# Patient Record
Sex: Male | Born: 1990 | Race: White | Hispanic: No
Health system: Southern US, Community
[De-identification: ages and names within clinical notes are randomized; demographics above are authoritative.]

## PROBLEM LIST (undated history)

## (undated) DIAGNOSIS — R569 Unspecified convulsions: Secondary | ICD-10-CM

---
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
## 2012-03-23 ENCOUNTER — Emergency Department (HOSPITAL_COMMUNITY)
Admission: EM | Admit: 2012-03-23 | Discharge: 2012-03-23 | Disposition: A | Discharge status: Home / Self Care | Payer: Self-pay | Attending: Emergency Medicine | Admitting: Emergency Medicine

## 2012-03-23 ENCOUNTER — Encounter (HOSPITAL_COMMUNITY): Payer: Self-pay | Admitting: *Deleted

## 2012-03-23 DIAGNOSIS — R112 Nausea with vomiting, unspecified: Secondary | ICD-10-CM | POA: Insufficient documentation

## 2012-03-23 DIAGNOSIS — R569 Unspecified convulsions: Secondary | ICD-10-CM | POA: Insufficient documentation

## 2012-03-23 DIAGNOSIS — R000 Tachycardia, unspecified: Secondary | ICD-10-CM | POA: Insufficient documentation

## 2012-03-23 DIAGNOSIS — Z79899 Other long term (current) drug therapy: Secondary | ICD-10-CM | POA: Insufficient documentation

## 2012-03-23 HISTORY — DX: Unspecified convulsions: R56.9

## 2012-03-23 LAB — POCT I-STAT, CHEM 8
Chloride: 112 mEq/L (ref 96–112)
Glucose, Bld: 107 mg/dL — ABNORMAL HIGH (ref 70–99)
HCT: 51 % (ref 39.0–52.0)
Hemoglobin: 17.3 g/dL — ABNORMAL HIGH (ref 13.0–17.0)
Potassium: 4 mEq/L (ref 3.5–5.1)
Sodium: 142 mEq/L (ref 135–145)

## 2012-03-23 MED ORDER — SODIUM CHLORIDE 0.9 % IV BOLUS (SEPSIS)
1000.0000 mL | Freq: Once | INTRAVENOUS | Status: AC
  Administered 2012-03-23: 1000 mL via INTRAVENOUS

## 2012-03-23 MED ORDER — ONDANSETRON HCL 4 MG/2ML IJ SOLN
4.0000 mg | Freq: Once | INTRAMUSCULAR | Status: AC
  Administered 2012-03-23: 4 mg via INTRAVENOUS

## 2012-03-23 MED ORDER — ONDANSETRON HCL 4 MG/2ML IJ SOLN
INTRAMUSCULAR | Status: AC
  Administered 2012-03-23: 4 mg via INTRAVENOUS
  Filled 2012-03-23: qty 2

## 2012-03-23 NOTE — ED Notes (Addendum)
Pt had witnessed seizure at home (lasting about 2-3 minutes). Pt has history of seizures. Pt actively vomiting en route and on arrival to room (pt typically has vomiting after seizure. Pt meds recently changed to lamictal about a month ago.

## 2012-03-23 NOTE — ED Notes (Signed)
Bed:WA08<BR> Expected date:<BR> Expected time:<BR> Means of arrival:<BR> Comments:<BR> EMS

## 2012-03-23 NOTE — ED Provider Notes (Signed)
Medical screening examination/treatment/procedure(s) were performed by non-physician practitioner and as supervising physician I was immediately available for consultation/collaboration.  Lindy Pennisi T Caston Coopersmith, MD 03/23/12 2303 

## 2012-03-23 NOTE — ED Provider Notes (Signed)
History     CSN: [id number]  Arrival date & time 03/23/12  2057   First MD Initiated Contact with Patient 03/23/12 2107      Chief Complaint  Patient presents with  . Seizures    (Consider location/radiation/quality/duration/timing/severity/associated sxs/prior treatment) HPI Comments: Patient with history of epileptic seizures presents today with complaint of seizure. Seizure was witnessed by the patient's sister. Patient had 2-3 minutes of tonic-clonic activity (she states that this was usual seizure). Patient was helped to floor and did not fall. + aura, + postictal state. Patient states that he has not been sleeping well and lack of sleep is a trigger for seizures. Patient sees a neurologist in [place]. Patient has been taking Topamax and Lamictal is been added 3 weeks ago. Patient does not currently complain of any pain. Positive loss of bladder. No recent illness or fever. Patient vomited during transit the hospital. No shortness of breath. Onset acute. Course results. Nothing makes symptoms better or worse.  The history is provided by the patient.    No past medical history on file.  No past surgical history on file.  No family history on file.  History  Substance Use Topics  . Smoking status: Not on file  . Smokeless tobacco: Not on file  . Alcohol Use: Not on file      Review of Systems  Constitutional: Negative for fever.  HENT: Positive for congestion and rhinorrhea. Negative for sore throat.   Eyes: Negative for redness.  Respiratory: Negative for cough.   Cardiovascular: Negative for chest pain.  Gastrointestinal: Positive for nausea and vomiting. Negative for abdominal pain and diarrhea.  Genitourinary: Negative for dysuria.  Musculoskeletal: Negative for myalgias.  Skin: Negative for rash.  Neurological: Positive for seizures. Negative for syncope and headaches.    Allergies  Review of patient's allergies indicates not on file.  Home  Medications   Current Outpatient Rx  Name Route Sig Dispense Refill  . CETIRIZINE HCL 10 MG PO TABS Oral Take 10 mg by mouth as needed. Allergies    . IBUPROFEN 200 MG PO TABS Oral Take 200 mg by mouth every 6 (six) hours as needed. Pain    . LAMOTRIGINE 25 MG PO TABS Oral Take 25 mg by mouth 2 (two) times daily.    . TOPIRAMATE 200 MG PO TABS Oral Take 200 mg by mouth 2 (two) times daily.      BP 127/55  Pulse 141  Resp 23  SpO2 89%  Physical Exam  Nursing note and vitals reviewed. Constitutional: He is oriented to person, place, and time. He appears well-developed and well-nourished.  HENT:  Head: Normocephalic and atraumatic. Head is without raccoon's eyes and without Battle's sign.  Right Ear: Tympanic membrane, external ear and ear canal normal. No hemotympanum.  Left Ear: Tympanic membrane, external ear and ear canal normal. No hemotympanum.  Nose: Mucosal edema and rhinorrhea present. No nasal septal hematoma.  Mouth/Throat: Oropharynx is clear and moist. Mucous membranes are dry.  Eyes: Conjunctivae normal, EOM and lids are normal. Pupils are equal, round, and reactive to light. Right eye exhibits no discharge. Left eye exhibits no discharge.       No visible hyphema  Neck: Normal range of motion. Neck supple.  Cardiovascular: Regular rhythm and normal heart sounds.  Tachycardia present.   Pulmonary/Chest: Effort normal and breath sounds normal.  Abdominal: Soft. There is no tenderness.  Musculoskeletal: Normal range of motion.       Cervical back: He  exhibits normal range of motion, no tenderness and no bony tenderness.       Thoracic back: He exhibits no tenderness and no bony tenderness.       Lumbar back: He exhibits no tenderness and no bony tenderness.  Neurological: He is alert and oriented to person, place, and time. He has normal strength and normal reflexes. No cranial nerve deficit or sensory deficit. Coordination and gait normal. GCS eye subscore is 4. GCS verbal  subscore is 5. GCS motor subscore is 6.  Skin: Skin is warm and dry.  Psychiatric: He has a normal mood and affect.    ED Course  Procedures (including critical care time)  Labs Reviewed  POCT I-STAT, CHEM 8 - Abnormal; Notable for the following:    Glucose, Bld 107 (*)     Calcium, Ion 1.28 (*)     Hemoglobin 17.3 (*)     All other components within normal limits  GLUCOSE, CAPILLARY   No results found.   1. Seizure     9:17 PM Patient seen and examined. Work-up initiated. HR 140.    Vital signs reviewed and are as follows: Filed Vitals:   03/23/12 2109  BP: 127/55  Pulse: 141  Resp: 23   9:54 PM HR 115 on re-exam. Will give fluids, check EKG.    Date: 03/23/2012  Rate: 127  Rhythm: sinus tachycardia  QRS Axis: normal  Intervals: normal  ST/T Wave abnormalities: normal  Conduction Disutrbances:none  Narrative Interpretation:   Old EKG Reviewed: none available  10:57 PM heart rate improved into the 90s with fluid bolus. Patient and family agreeable to discharge. Patient appears well. Urged followup with primary care physician and neurologist. Patient to continue taking Topamax and lamotrigine as prescribed. Patient urged to return with worsening symptoms, he has another seizure, or if any other concerns. Patient and family verbalized understanding and agreed plan.  BP 123/76  Pulse 96  Temp 98.1 F (36.7 C) (Oral)  Resp 14  SpO2 100%   MDM  Patient with typical seizure, likely secondary to lack of sleep. Patient has improved during ED stay and has not had another seizure. Initially tachycardic, improved with fluids. Patient appears well. He has neurology followup. Family will watch patient. No evidence of significant head or neck injury. No evidence of significant infection.        Renne Crigler, [place] 03/23/12 2259

## 2019-11-19 ENCOUNTER — Emergency Department (HOSPITAL_COMMUNITY)
Admission: EM | Admit: 2019-11-19 | Discharge: 2019-11-19 | Disposition: A | Discharge status: Home / Self Care | Payer: Self-pay | Attending: Emergency Medicine | Admitting: Emergency Medicine

## 2019-11-19 ENCOUNTER — Emergency Department (HOSPITAL_COMMUNITY): Payer: Self-pay

## 2019-11-19 ENCOUNTER — Other Ambulatory Visit: Payer: Self-pay

## 2019-11-19 DIAGNOSIS — R040 Epistaxis: Secondary | ICD-10-CM | POA: Insufficient documentation

## 2019-11-19 DIAGNOSIS — R569 Unspecified convulsions: Secondary | ICD-10-CM | POA: Insufficient documentation

## 2019-11-19 DIAGNOSIS — Z79899 Other long term (current) drug therapy: Secondary | ICD-10-CM | POA: Insufficient documentation

## 2019-11-19 DIAGNOSIS — W01198A Fall on same level from slipping, tripping and stumbling with subsequent striking against other object, initial encounter: Secondary | ICD-10-CM | POA: Insufficient documentation

## 2019-11-19 LAB — CBC WITH DIFFERENTIAL/PLATELET
Abs Immature Granulocytes: 0.12 10*3/uL — ABNORMAL HIGH (ref 0.00–0.07)
Basophils Absolute: 0 10*3/uL (ref 0.0–0.1)
Basophils Relative: 0 %
Eosinophils Absolute: 0.1 10*3/uL (ref 0.0–0.5)
Eosinophils Relative: 1 %
HCT: 44.7 % (ref 39.0–52.0)
Hemoglobin: 14.6 g/dL (ref 13.0–17.0)
Immature Granulocytes: 1 %
Lymphocytes Relative: 12 %
Lymphs Abs: 1.6 10*3/uL (ref 0.7–4.0)
MCH: 27.5 pg (ref 26.0–34.0)
MCHC: 32.7 g/dL (ref 30.0–36.0)
MCV: 84.3 fL (ref 80.0–100.0)
Monocytes Absolute: 0.9 10*3/uL (ref 0.1–1.0)
Monocytes Relative: 7 %
Neutro Abs: 9.9 10*3/uL — ABNORMAL HIGH (ref 1.7–7.7)
Neutrophils Relative %: 79 %
Platelets: 296 10*3/uL (ref 150–400)
RBC: 5.3 MIL/uL (ref 4.22–5.81)
RDW: 13.1 % (ref 11.5–15.5)
WBC: 12.5 10*3/uL — ABNORMAL HIGH (ref 4.0–10.5)
nRBC: 0 % (ref 0.0–0.2)

## 2019-11-19 LAB — COMPREHENSIVE METABOLIC PANEL
ALT: 50 U/L — ABNORMAL HIGH (ref 0–44)
AST: 38 U/L (ref 15–41)
Albumin: 4.3 g/dL (ref 3.5–5.0)
Alkaline Phosphatase: 57 U/L (ref 38–126)
Anion gap: 12 (ref 5–15)
BUN: 16 mg/dL (ref 6–20)
CO2: 17 mmol/L — ABNORMAL LOW (ref 22–32)
Calcium: 9.2 mg/dL (ref 8.9–10.3)
Chloride: 106 mmol/L (ref 98–111)
Creatinine, Ser: 1.36 mg/dL — ABNORMAL HIGH (ref 0.61–1.24)
GFR calc Af Amer: >60 mL/min (ref >60)
GFR calc non Af Amer: >60 mL/min (ref >60)
Glucose, Bld: 118 mg/dL — ABNORMAL HIGH (ref 70–99)
Potassium: 4.2 mmol/L (ref 3.5–5.1)
Sodium: 135 mmol/L (ref 135–145)
Total Bilirubin: 0.6 mg/dL (ref 0.3–1.2)
Total Protein: 7.5 g/dL (ref 6.5–8.1)

## 2019-11-19 MED ORDER — LEVETIRACETAM IN NACL 1000 MG/100ML IV SOLN
1000.0000 mg | Freq: Once | INTRAVENOUS | Status: AC
  Administered 2019-11-19: 1000 mg via INTRAVENOUS
  Filled 2019-11-19: qty 100

## 2019-11-19 MED ORDER — ZONISAMIDE 100 MG PO CAPS
100.0000 mg | ORAL_CAPSULE | Freq: Every day | ORAL | Status: DC
  Administered 2019-11-19: 100 mg via ORAL
  Filled 2019-11-19: qty 1

## 2019-11-19 MED ORDER — SODIUM CHLORIDE 0.9 % IV BOLUS
1000.0000 mL | Freq: Once | INTRAVENOUS | Status: AC
  Administered 2019-11-19: 1000 mL via INTRAVENOUS

## 2019-11-19 MED ORDER — DIVALPROEX SODIUM ER 500 MG PO TB24
500.0000 mg | ORAL_TABLET | Freq: Every day | ORAL | Status: DC
  Administered 2019-11-19: 500 mg via ORAL
  Filled 2019-11-19: qty 1

## 2019-11-19 NOTE — ED Triage Notes (Signed)
Pt arrives from work by EMS where pt may have had a seizure. Pt coworkers estimated pt may have had LOC for about 15 minutes. Pt does have history of seizures. Pt was diaphoretic and postictal on EMS arrival. Pt arrives to ED alert and oriented x 4. Pt reportedly hit corner of table with head when he fell.

## 2019-11-19 NOTE — Discharge Instructions (Signed)
Your history and exam today are consistent with a breakthrough seizure in the setting of missing medications, increase in stress, dehydration, caffeine, and decreased sleep.  Please take your medications and follow-up with neurology.  Please try to work on the above things including hydration.  If any symptoms recur, change, or worsen, please return to the nearest emergency department.

## 2019-11-19 NOTE — ED Notes (Signed)
Patient transported to CT 

## 2019-11-19 NOTE — ED Provider Notes (Signed)
[facility] EMERGENCY DEPARTMENT Provider Note   CSN: [id number] Arrival date & time: 11/19/19  2041     History Chief Complaint  Patient presents with  . Loss of Consciousness    Ricky Reilly is a 29 y.o. male.  The history is provided by the patient, the EMS personnel and medical records. No language interpreter was used.  Seizures Seizure activity on arrival: no   Seizure type:  Unable to specify Initial focality:  None Postictal symptoms: confusion and somnolence   Return to baseline: yes   Severity:  Mild Timing:  Once Progression:  Resolved Context: decreased sleep, medical non-compliance and stress   Context: not drug use, not fever and not previous head injury   Recent head injury:  During the event PTA treatment:  None History of seizures: yes        Past Medical History:  Diagnosis Date  . Seizure     There are no problems to display for this patient.   No past surgical history on file.     No family history on file.  Social History   Tobacco Use  . Smoking status: Never Smoker  Substance Use Topics  . Alcohol use: No  . Drug use: No    Home Medications Prior to Admission medications   Medication Sig Start Date End Date Taking? Authorizing Provider  cetirizine (ZYRTEC) 10 MG tablet Take 10 mg by mouth as needed. Allergies    [provider]  ibuprofen (ADVIL,MOTRIN) 200 MG tablet Take 200 mg by mouth every 6 (six) hours as needed. Pain    [provider]  lamoTRIgine (LAMICTAL) 25 MG tablet Take 25 mg by mouth 2 (two) times daily.    [provider]  topiramate (TOPAMAX) 200 MG tablet Take 200 mg by mouth 2 (two) times daily.    [provider]    Allergies    Patient has no known allergies.  Review of Systems   Review of Systems  Constitutional: Negative for chills, diaphoresis, fatigue and fever.  HENT: Negative for congestion.   Eyes: Negative for photophobia, pain and visual  disturbance.  Respiratory: Negative for cough, chest tightness, shortness of breath and wheezing.   Cardiovascular: Negative for chest pain, palpitations and leg swelling.  Gastrointestinal: Negative for abdominal pain, constipation, diarrhea, nausea and vomiting.  Genitourinary: Negative for dysuria and flank pain.  Musculoskeletal: Negative for back pain, neck pain and neck stiffness.  Neurological: Positive for seizures. Negative for dizziness, weakness, light-headedness, numbness and headaches.  Psychiatric/Behavioral: Negative for agitation and confusion.  All other systems reviewed and are negative.   Physical Exam Updated Vital Signs BP 123/68   Pulse (!) 109   Temp 98.1 F (36.7 C) (Axillary)   Resp 20   SpO2 94%   Physical Exam Vitals and nursing note reviewed.  Constitutional:      General: He is not in acute distress.    Appearance: He is well-developed. He is not ill-appearing, toxic-appearing or diaphoretic.  HENT:     Head:      Nose: Signs of injury present. No laceration, congestion or rhinorrhea.     Right Nostril: No septal hematoma or occlusion.     Left Nostril: Epistaxis present. No septal hematoma or occlusion.     Comments: Dried blood in the nares with no nasal septal hematoma.  Tenderness on the nose.  Abrasion to the tongue with no laceration.  Hemostatic.  Normal extraocular movements with no evidence of entrapment.  Pupils exam unremarkable.    Mouth/Throat:     Mouth: Mucous membranes are moist.     Pharynx: No oropharyngeal exudate or posterior oropharyngeal erythema.  Eyes:     Extraocular Movements: Extraocular movements intact.     Conjunctiva/sclera: Conjunctivae normal.     Pupils: Pupils are equal, round, and reactive to light.  Cardiovascular:     Rate and Rhythm: Normal rate and regular rhythm.     Pulses: Normal pulses.     Heart sounds: No murmur heard.   Pulmonary:     Effort: Pulmonary effort is normal. No respiratory distress.      Breath sounds: Normal breath sounds. No wheezing, rhonchi or rales.  Chest:     Chest wall: No tenderness.  Abdominal:     General: Abdomen is flat.     Palpations: Abdomen is soft.     Tenderness: There is no abdominal tenderness. There is no right CVA tenderness, left CVA tenderness, guarding or rebound.  Musculoskeletal:        General: Tenderness present.     Cervical back: Neck supple. No tenderness.  Skin:    General: Skin is warm and dry.     Capillary Refill: Capillary refill takes less than 2 seconds.     Findings: No erythema.  Neurological:     General: No focal deficit present.     Mental Status: He is alert and oriented to person, place, and time.     Cranial Nerves: No cranial nerve deficit.     Sensory: No sensory deficit.     Motor: No weakness.  Psychiatric:        Mood and Affect: Mood normal.     ED Results / Procedures / Treatments   Labs (all labs ordered are listed, but only abnormal results are displayed) Labs Reviewed  CBC WITH DIFFERENTIAL/PLATELET - Abnormal; Notable for the following components:      Result Value   WBC 12.5 (*)    Neutro Abs 9.9 (*)    Abs Immature Granulocytes 0.12 (*)    All other components within normal limits  COMPREHENSIVE METABOLIC PANEL - Abnormal; Notable for the following components:   CO2 17 (*)    Glucose, Bld 118 (*)    Creatinine, Ser 1.36 (*)    ALT 50 (*)    All other components within normal limits    EKG None  Radiology CT Head Wo Contrast  Result Date: 11/19/2019 CLINICAL DATA:  29 year old male with head trauma. EXAM: CT HEAD WITHOUT CONTRAST CT MAXILLOFACIAL WITHOUT CONTRAST TECHNIQUE: Multidetector CT imaging of the head and maxillofacial structures were performed using the standard protocol without intravenous contrast. Multiplanar CT image reconstructions of the maxillofacial structures were also generated. COMPARISON:  Head CT dated 06/01/2005. FINDINGS: CT HEAD FINDINGS Brain: No evidence of acute  infarction, hemorrhage, hydrocephalus, extra-axial collection or mass lesion/mass effect. Vascular: No hyperdense vessel or unexpected calcification. Skull: Normal. Negative for fracture or focal lesion. Other: None. CT MAXILLOFACIAL FINDINGS Osseous: No acute fracture. No mandibular dislocation. Orbits: The globes and retro-orbital fat are preserved. Sinuses: Clear. Soft tissues: Negative. IMPRESSION: 1. Normal unenhanced CT of the brain. 2. No acute/traumatic facial bone fractures. Electronically Signed   By: Anner Crete M.D.   On: 11/19/2019 22:16   CT Maxillofacial Wo Contrast  Result Date: 11/19/2019 CLINICAL DATA:  29 year old male with head trauma. EXAM: CT HEAD WITHOUT CONTRAST CT MAXILLOFACIAL WITHOUT CONTRAST TECHNIQUE: Multidetector CT imaging of the head and maxillofacial structures were performed  using the standard protocol without intravenous contrast. Multiplanar CT image reconstructions of the maxillofacial structures were also generated. COMPARISON:  Head CT dated 06/01/2005. FINDINGS: CT HEAD FINDINGS Brain: No evidence of acute infarction, hemorrhage, hydrocephalus, extra-axial collection or mass lesion/mass effect. Vascular: No hyperdense vessel or unexpected calcification. Skull: Normal. Negative for fracture or focal lesion. Other: None. CT MAXILLOFACIAL FINDINGS Osseous: No acute fracture. No mandibular dislocation. Orbits: The globes and retro-orbital fat are preserved. Sinuses: Clear. Soft tissues: Negative. IMPRESSION: 1. Normal unenhanced CT of the brain. 2. No acute/traumatic facial bone fractures. Electronically Signed   By: Elgie Collard M.D.   On: 11/19/2019 22:16    Procedures Procedures (including critical care time)  Medications Ordered in ED Medications  divalproex (DEPAKOTE ER) 24 hr tablet 500 mg (500 mg Oral Given 11/19/19 2220)  zonisamide (ZONEGRAN) capsule 100 mg (100 mg Oral Given 11/19/19 2220)  sodium chloride 0.9 % bolus 1,000 mL (0 mLs Intravenous  Stopped 11/19/19 2223)  levETIRAcetam (KEPPRA) IVPB 1000 mg/100 mL premix (0 mg Intravenous Stopped 11/19/19 2259)    ED Course  I have reviewed the triage vital signs and the nursing notes.  Pertinent labs & imaging results that were available during my care of the patient were reviewed by me and considered in my medical decision making (see chart for details).    MDM Rules/Calculators/A&P                          Ricky Reilly is a 29 y.o. male with a past medical history significant for epilepsy currently on a regimen of Depakote, Keppra, and Zonegran who presents for seizure.  Patient says that he started a new job recently and has had increase in stress, increase in caffeine, decrease in sleep, and he reports he thinks he missed his seizure medicine for the last day or 2.  He was at work and was found down in the break room with blood on his face and acting postictal.  Patient thinks he had a several minute seizure and caused the fall.  He otherwise denies any other symptoms today including no recent preceding headaches, neck pain, nausea, vomiting, fevers, chills, neck stiffness, urinary symptoms or GI symptoms.  He otherwise feels back to his baseline now.  EMS reports that he was slightly hypoxic during transport but that has improved during transport.  He also was postictal initially but is now back to his baseline.  On exam, lungs are clear and chest is nontender.  Abdomen is nontender.  No focal neurologic deficits.  Patient does have some tenderness on his nose and some dried blood in his nares.  No evidence of nasal septal hematoma on initial exam.  Normal extraocular movements and pupil exam is unremarkable.  No neck pain or neck tenderness.  Due to the obvious injury to the face and head, will get a CT head and face.  We will also get screening blood work to look for electrolyte imbalances.  As the patient is missed his seizure medications, we will give the home meds orally as well as  IV Keppra to load him.  We will rehydrate the patient due to slight tachycardia.  If work-up is reassuring and he is feeling better, dissipate discharge to follow-up with his neurologist as I suspect this breakthrough seizure was due to missed medication in the context of the stress, caffeine, and missed sleep.  Patient is agreement to this plan, anticipate discharge if work-up is reassuring.  Work-up is reassuring.  Mild leukocytosis likely demargination with a seizure.  Creatinine slightly more elevated than prior however it is not an AKI for him.  Fluids were given for likely mild dehydration.  CT is reassuring with no nasal fractures or intracranial injury.  Patient was loaded with Keppra and will take his home medications.  He agreed this is likely related to breakthrough due to missing medications and the other things we discussed.  Patient will follow up with outpatient neurology.  Patient agrees with plan of care and was discharged in good condition.   Final Clinical Impression(s) / ED Diagnoses Final diagnoses:  Seizure (HCC)    Rx / DC Orders ED Discharge Orders    None      Clinical Impression: 1. Seizure [facility])     Disposition: Discharge  Condition: Good  I have discussed the results, Dx and Tx plan with the pt(& family if present). He/she/they expressed understanding and agree(s) with the plan. Discharge instructions discussed at great length. Strict return precautions discussed and pt &/or family have verbalized understanding of the instructions. No further questions at time of discharge.    New Prescriptions   No medications on file    Follow Up: Your neurologist        Namiyah Grantham, Canary Brim, MD 11/19/19 2316

## 2020-06-07 ENCOUNTER — Other Ambulatory Visit: Payer: Self-pay

## 2020-06-07 ENCOUNTER — Encounter (HOSPITAL_BASED_OUTPATIENT_CLINIC_OR_DEPARTMENT_OTHER): Payer: Self-pay

## 2020-06-07 ENCOUNTER — Emergency Department (HOSPITAL_BASED_OUTPATIENT_CLINIC_OR_DEPARTMENT_OTHER): Payer: PRIVATE HEALTH INSURANCE

## 2020-06-07 ENCOUNTER — Emergency Department (HOSPITAL_BASED_OUTPATIENT_CLINIC_OR_DEPARTMENT_OTHER)
Admission: EM | Admit: 2020-06-07 | Discharge: 2020-06-07 | Disposition: A | Discharge status: Home / Self Care | Payer: PRIVATE HEALTH INSURANCE | Attending: Emergency Medicine | Admitting: Emergency Medicine

## 2020-06-07 DIAGNOSIS — S0512XA Contusion of eyeball and orbital tissues, left eye, initial encounter: Secondary | ICD-10-CM | POA: Diagnosis not present

## 2020-06-07 DIAGNOSIS — Z91040 Latex allergy status: Secondary | ICD-10-CM | POA: Diagnosis not present

## 2020-06-07 DIAGNOSIS — S0993XA Unspecified injury of face, initial encounter: Secondary | ICD-10-CM | POA: Diagnosis present

## 2020-06-07 DIAGNOSIS — R569 Unspecified convulsions: Secondary | ICD-10-CM | POA: Diagnosis not present

## 2020-06-07 DIAGNOSIS — X58XXXA Exposure to other specified factors, initial encounter: Secondary | ICD-10-CM | POA: Diagnosis not present

## 2020-06-07 LAB — CBC WITH DIFFERENTIAL/PLATELET
Abs Immature Granulocytes: 0.37 10*3/uL — ABNORMAL HIGH (ref 0.00–0.07)
Basophils Absolute: 0.1 10*3/uL (ref 0.0–0.1)
Basophils Relative: 1 %
Eosinophils Absolute: 0.4 10*3/uL (ref 0.0–0.5)
Eosinophils Relative: 2 %
HCT: 42.3 % (ref 39.0–52.0)
Hemoglobin: 14.4 g/dL (ref 13.0–17.0)
Immature Granulocytes: 2 %
Lymphocytes Relative: 25 %
Lymphs Abs: 4.2 10*3/uL — ABNORMAL HIGH (ref 0.7–4.0)
MCH: 28.9 pg (ref 26.0–34.0)
MCHC: 34 g/dL (ref 30.0–36.0)
MCV: 84.8 fL (ref 80.0–100.0)
Monocytes Absolute: 1.3 10*3/uL — ABNORMAL HIGH (ref 0.1–1.0)
Monocytes Relative: 8 %
Neutro Abs: 10.6 10*3/uL — ABNORMAL HIGH (ref 1.7–7.7)
Neutrophils Relative %: 62 %
Platelets: 451 10*3/uL — ABNORMAL HIGH (ref 150–400)
RBC: 4.99 MIL/uL (ref 4.22–5.81)
RDW: 13.2 % (ref 11.5–15.5)
WBC: 16.9 10*3/uL — ABNORMAL HIGH (ref 4.0–10.5)
nRBC: 0 % (ref 0.0–0.2)

## 2020-06-07 LAB — BASIC METABOLIC PANEL
Anion gap: 20 — ABNORMAL HIGH (ref 5–15)
BUN: 16 mg/dL (ref 6–20)
CO2: 15 mmol/L — ABNORMAL LOW (ref 22–32)
Calcium: 9.3 mg/dL (ref 8.9–10.3)
Chloride: 100 mmol/L (ref 98–111)
Creatinine, Ser: 1.34 mg/dL — ABNORMAL HIGH (ref 0.61–1.24)
GFR, Estimated: >60 mL/min (ref >60)
Glucose, Bld: 112 mg/dL — ABNORMAL HIGH (ref 70–99)
Potassium: 4.1 mmol/L (ref 3.5–5.1)
Sodium: 135 mmol/L (ref 135–145)

## 2020-06-07 LAB — CBG MONITORING, ED: Glucose-Capillary: 101 mg/dL — ABNORMAL HIGH (ref 70–99)

## 2020-06-07 NOTE — ED Provider Notes (Signed)
[facility] EMERGENCY DEPARTMENT Provider Note   CSN: [id number] Arrival date & time: 06/07/20  1711     History Chief Complaint  Patient presents with  . Seizures    Ricky Reilly is a 30 y.o. male.  The history is provided by the patient.  Seizures Seizure activity on arrival: no   Seizure type:  Unable to specify Initial focality:  Unable to specify Postictal symptoms: somnolence   Return to baseline: no   Severity:  Mild Number of seizures this episode:  1 Recent head injury: bloody nose, bruise left eye. PTA treatment:  None History of seizures: yes        Past Medical History:  Diagnosis Date  . Seizure (HCC)     There are no problems to display for this patient.   History reviewed. No pertinent surgical history.     No family history on file.  Social History   Tobacco Use  . Smoking status: Never Smoker  . Smokeless tobacco: Never Used  Substance Use Topics  . Alcohol use: No  . Drug use: No    Home Medications Prior to Admission medications   Medication Sig Start Date End Date Taking? Authorizing Provider  divalproex (DEPAKOTE ER) 500 MG 24 hr tablet Take 500 mg by mouth every evening. 01/28/19   [provider]  ibuprofen (ADVIL,MOTRIN) 200 MG tablet Take 200 mg by mouth every 6 (six) hours as needed. Pain    [provider]  levETIRAcetam (KEPPRA) 500 MG tablet Take 500 mg by mouth 2 (two) times daily. 08/19/19   [provider]  zonisamide (ZONEGRAN) 100 MG capsule Take 200 mg by mouth 2 (two) times daily. 05/16/18   [provider]    Allergies    Latex  Review of Systems   Review of Systems  Constitutional: Negative for chills and fever.  HENT: Positive for nosebleeds. Negative for ear pain and sore throat.   Eyes: Negative for pain and visual disturbance.  Respiratory: Negative for cough and shortness of breath.   Cardiovascular: Negative for chest pain and palpitations.   Gastrointestinal: Negative for abdominal pain and vomiting.  Genitourinary: Negative for dysuria and hematuria.  Musculoskeletal: Negative for arthralgias and back pain.  Skin: Positive for color change. Negative for rash.  Neurological: Positive for seizures. Negative for syncope.  All other systems reviewed and are negative.   Physical Exam Updated Vital Signs  ED Triage Vitals  Enc Vitals Group     BP 06/07/20 1725 125/60     Pulse Rate 06/07/20 1722 (!) 109     Resp 06/07/20 1722 18     Temp 06/07/20 1726 (!) 96.9 F (36.1 C)     Temp Source 06/07/20 1726 Axillary     SpO2 06/07/20 1725 96 %     Weight 06/07/20 1718 (!) 315 lb 4.8 oz (143 kg)     Height 06/07/20 1718 6' (1.829 m)     Head Circumference --      Peak Flow --      Pain Score 06/07/20 1717 6     Pain Loc --      Pain Edu? --      Excl. in GC? --     Physical Exam Vitals and nursing note reviewed.  Constitutional:      General: He is not in acute distress.    Appearance: He is well-developed and well-nourished. He is not ill-appearing.  HENT:     Head:  Comments: Bruise around the left eye, no lacerations to the scalp, dried blood around the nares, no laceration to the face    Mouth/Throat:     Mouth: Mucous membranes are moist.  Eyes:     Extraocular Movements: Extraocular movements intact.     Conjunctiva/sclera: Conjunctivae normal.     Pupils: Pupils are equal, round, and reactive to light.  Cardiovascular:     Rate and Rhythm: Normal rate and regular rhythm.     Pulses: Normal pulses.     Heart sounds: Normal heart sounds. No murmur heard.   Pulmonary:     Effort: Pulmonary effort is normal. No respiratory distress.     Breath sounds: Normal breath sounds.  Abdominal:     Palpations: Abdomen is soft.     Tenderness: There is no abdominal tenderness.  Musculoskeletal:        General: No edema.     Cervical back: Neck supple.  Skin:    General: Skin is warm and dry.  Neurological:      General: No focal deficit present.     Mental Status: He is alert and oriented to person, place, and time.     Cranial Nerves: No cranial nerve deficit.     Sensory: No sensory deficit.     Motor: No weakness.     Coordination: Coordination normal.     Comments: Somnolent but appears to be neurologically intact, 5+ out of 5 strength all, normal sensation  Psychiatric:        Mood and Affect: Mood and affect normal.     ED Results / Procedures / Treatments   Labs (all labs ordered are listed, but only abnormal results are displayed) Labs Reviewed  CBC WITH DIFFERENTIAL/PLATELET - Abnormal; Notable for the following components:      Result Value   WBC 16.9 (*)    Platelets 451 (*)    Neutro Abs 10.6 (*)    Lymphs Abs 4.2 (*)    Monocytes Absolute 1.3 (*)    Abs Immature Granulocytes 0.37 (*)    All other components within normal limits  BASIC METABOLIC PANEL - Abnormal; Notable for the following components:   CO2 15 (*)    Glucose, Bld 112 (*)    Creatinine, Ser 1.34 (*)    Anion gap 20 (*)    All other components within normal limits  CBG MONITORING, ED - Abnormal; Notable for the following components:   Glucose-Capillary 101 (*)    All other components within normal limits    EKG EKG shows sinus rhythm with normal intervals  Radiology CT Head Wo Contrast  Result Date: 06/07/2020 CLINICAL DATA:  Possible seizure.  Of tended. EXAM: CT HEAD WITHOUT CONTRAST CT MAXILLOFACIAL WITHOUT CONTRAST CT CERVICAL SPINE WITHOUT CONTRAST TECHNIQUE: Multidetector CT imaging of the head, cervical spine, and maxillofacial structures were performed using the standard protocol without intravenous contrast. Multiplanar CT image reconstructions of the cervical spine and maxillofacial structures were also generated. COMPARISON:  11/19/2019 FINDINGS: CT HEAD FINDINGS Brain: The brain shows a normal appearance without evidence of malformation, atrophy, old or acute small or large vessel  infarction, mass lesion, hemorrhage, hydrocephalus or extra-axial collection. Vascular: No hyperdense vessel. No evidence of atherosclerotic calcification. Skull: Normal.  No traumatic finding.  No focal bone lesion. Sinuses/Orbits: Sinuses are clear. Orbits appear normal. Mastoids are clear. Other: None significant CT MAXILLOFACIAL FINDINGS Osseous: No facial fracture. Orbits: No intraorbital injury. Periorbital soft tissue swelling, left more than right. Sinuses: Clear  Soft tissues: Periorbital soft tissue swelling superficially, left more than right, as noted above. CT CERVICAL SPINE FINDINGS Alignment: No traumatic malalignment. Skull base and vertebrae: No fracture or primary bone lesion. Soft tissues and spinal canal: No evidence of soft tissue injury or mass lesion. Disc levels: No degenerative disease. No stenosis of the canal or foramina. Upper chest: Normal Other: None IMPRESSION: HEAD CT: Normal. MAXILLOFACIAL CT: No facial fracture. Periorbital soft tissue swelling, left more than right. No intraorbital injury. CERVICAL SPINE CT: Normal. Electronically Signed   By: Paulina Fusi M.D.   On: 06/07/2020 18:28   CT Cervical Spine Wo Contrast  Result Date: 06/07/2020 CLINICAL DATA:  Possible seizure.  Of tended. EXAM: CT HEAD WITHOUT CONTRAST CT MAXILLOFACIAL WITHOUT CONTRAST CT CERVICAL SPINE WITHOUT CONTRAST TECHNIQUE: Multidetector CT imaging of the head, cervical spine, and maxillofacial structures were performed using the standard protocol without intravenous contrast. Multiplanar CT image reconstructions of the cervical spine and maxillofacial structures were also generated. COMPARISON:  11/19/2019 FINDINGS: CT HEAD FINDINGS Brain: The brain shows a normal appearance without evidence of malformation, atrophy, old or acute small or large vessel infarction, mass lesion, hemorrhage, hydrocephalus or extra-axial collection. Vascular: No hyperdense vessel. No evidence of atherosclerotic calcification.  Skull: Normal.  No traumatic finding.  No focal bone lesion. Sinuses/Orbits: Sinuses are clear. Orbits appear normal. Mastoids are clear. Other: None significant CT MAXILLOFACIAL FINDINGS Osseous: No facial fracture. Orbits: No intraorbital injury. Periorbital soft tissue swelling, left more than right. Sinuses: Clear Soft tissues: Periorbital soft tissue swelling superficially, left more than right, as noted above. CT CERVICAL SPINE FINDINGS Alignment: No traumatic malalignment. Skull base and vertebrae: No fracture or primary bone lesion. Soft tissues and spinal canal: No evidence of soft tissue injury or mass lesion. Disc levels: No degenerative disease. No stenosis of the canal or foramina. Upper chest: Normal Other: None IMPRESSION: HEAD CT: Normal. MAXILLOFACIAL CT: No facial fracture. Periorbital soft tissue swelling, left more than right. No intraorbital injury. CERVICAL SPINE CT: Normal. Electronically Signed   By: Paulina Fusi M.D.   On: 06/07/2020 18:28   DG Chest Portable 1 View  Result Date: 06/07/2020 CLINICAL DATA:  Fall.  Pain. EXAM: PORTABLE CHEST 1 VIEW COMPARISON:  None. FINDINGS: The heart size and mediastinal contours are within normal limits. Both lungs are clear. The visualized skeletal structures are unremarkable. IMPRESSION: No active disease. Electronically Signed   By: Gerome Sam III M.D   On: 06/07/2020 18:38   CT Maxillofacial Wo Contrast  Result Date: 06/07/2020 CLINICAL DATA:  Possible seizure.  Of tended. EXAM: CT HEAD WITHOUT CONTRAST CT MAXILLOFACIAL WITHOUT CONTRAST CT CERVICAL SPINE WITHOUT CONTRAST TECHNIQUE: Multidetector CT imaging of the head, cervical spine, and maxillofacial structures were performed using the standard protocol without intravenous contrast. Multiplanar CT image reconstructions of the cervical spine and maxillofacial structures were also generated. COMPARISON:  11/19/2019 FINDINGS: CT HEAD FINDINGS Brain: The brain shows a normal appearance without  evidence of malformation, atrophy, old or acute small or large vessel infarction, mass lesion, hemorrhage, hydrocephalus or extra-axial collection. Vascular: No hyperdense vessel. No evidence of atherosclerotic calcification. Skull: Normal.  No traumatic finding.  No focal bone lesion. Sinuses/Orbits: Sinuses are clear. Orbits appear normal. Mastoids are clear. Other: None significant CT MAXILLOFACIAL FINDINGS Osseous: No facial fracture. Orbits: No intraorbital injury. Periorbital soft tissue swelling, left more than right. Sinuses: Clear Soft tissues: Periorbital soft tissue swelling superficially, left more than right, as noted above. CT CERVICAL SPINE FINDINGS Alignment: No  traumatic malalignment. Skull base and vertebrae: No fracture or primary bone lesion. Soft tissues and spinal canal: No evidence of soft tissue injury or mass lesion. Disc levels: No degenerative disease. No stenosis of the canal or foramina. Upper chest: Normal Other: None IMPRESSION: HEAD CT: Normal. MAXILLOFACIAL CT: No facial fracture. Periorbital soft tissue swelling, left more than right. No intraorbital injury. CERVICAL SPINE CT: Normal. Electronically Signed   By: Paulina FusiMark  Shogry M.D.   On: 06/07/2020 18:28    Procedures Procedures (including critical care time)  Medications Ordered in ED Medications - No data to display  ED Course  I have reviewed the triage vital signs and the nursing notes.  Pertinent labs & imaging results that were available during my care of the patient were reviewed by me and considered in my medical decision making (see chart for details).    MDM Rules/Calculators/A&P                          Ricky RoeMatthew Quirion is a 30 year old male with history of seizures who presents the ED after breakthrough seizure.  Normal vitals.  No fever.  Patient somnolent but awake and neurologically intact.  States he had a seizure at work.  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  He has dried blood at his nare.  No active nasal bleeding.  Has  bruising on the left eye.  CT of the head, face, neck are unremarkable.  Lab work unremarkable.  Patient is waking up more.  Will allow him to continue to return to normal.  No other seizure events as of now.  He has been compliant with his medications.  CT scans are unremarkable. Patient with unremarkable labs. Patient returned to baseline. He was able to ambulate without any issues. Has been compliant with his seizure medications. He does have some breakthrough seizures at time. Recommend that he follow-up with neurology. Given return precautions and discharged from the ED in good condition.  This chart was dictated using voice recognition software.  Despite best efforts to proofread,  errors can occur which can change the documentation meaning.    Final Clinical Impression(s) / ED Diagnoses Final diagnoses:  Seizure [facility](HCC)    Rx / DC Orders ED Discharge Orders    None       Virgina NorfolkCuratolo, Sae Handrich, DO 06/07/20 1929

## 2020-06-07 NOTE — Discharge Instructions (Signed)
Follow up with your neurologist.

## 2020-06-07 NOTE — ED Triage Notes (Signed)
Pt arrives via [facility] from work where patient was found in walk-in cooler, assumed seizure, pt was postictal, works with room mate who reported to EMS that patient was at his baseline following seizure. Pt oriented upon arrival with swelling in upper lip and nose, dried blood on face.

## 2020-06-07 NOTE — ED Notes (Signed)
No active bleeding noted at this time, but rt and lt nare very bloody, also noted to have swelling at left eyelid/ eyebrow area with slight bruising.

## 2020-06-07 NOTE — ED Notes (Signed)
Arrived via EMS secondary to having a seizure, Alert upon arrival, follows commands. Immediately placed on cont cardiac monitoring and CBG obtained, IV established, EDP to bedside. Seizure Pads to stretcher applied, sr x 2 up, bed in lowest position.

## 2020-06-07 NOTE — ED Notes (Signed)
Noted to fall asleep very quickly, POX on RA when asleep reveals 90%, placed on 2lpm via [place] (pt states he is not aware of having OSA, but is scheduled for a sleep study) Snoring is noted intermittently

## 2020-06-07 NOTE — ED Notes (Signed)
Ambulated to restroom without assistance, gait very steady, voiced no complaints

## 2021-11-09 IMAGING — CT CT HEAD W/O CM
3 series · 14 of 47 positions shown, 16 images · non-contrast
Comparison: 11/19/2019

CLINICAL DATA: Possible seizure.  Of tended.

EXAM:
CT HEAD WITHOUT CONTRAST
CT MAXILLOFACIAL WITHOUT CONTRAST
CT CERVICAL SPINE WITHOUT CONTRAST
TECHNIQUE: Multidetector CT imaging of the head, cervical spine, and
maxillofacial structures were performed using the standard protocol
without intravenous contrast. Multiplanar CT image reconstructions
of the cervical spine and maxillofacial structures were also
generated.

[Series 2: head wo · axial · 0.48mm/px · z∈[+843,+983]mm · 8 of 34 slices shown, 10 images]
[im 3/34  brain]
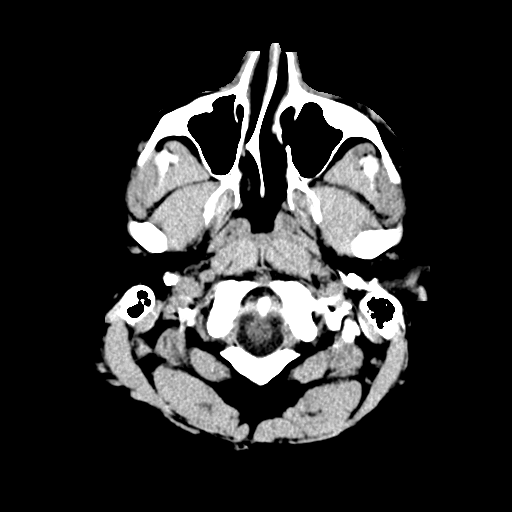
[im 3/34  bone]
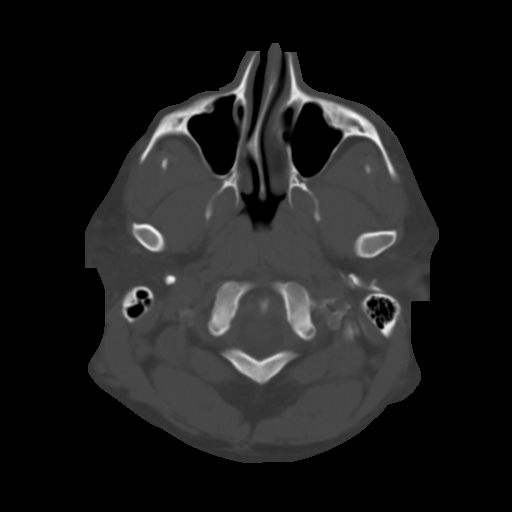
[im 7/34  brain]
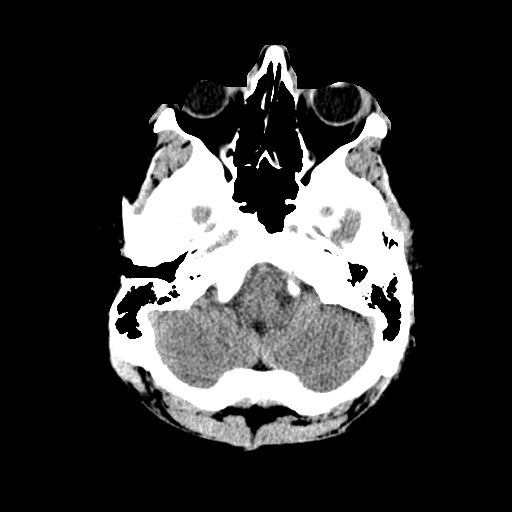
[im 11/34  brain]
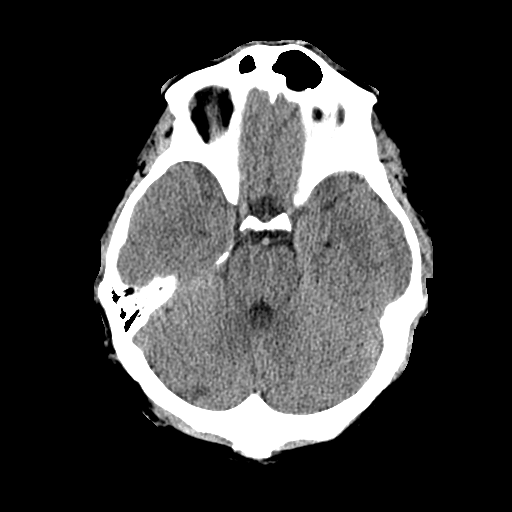
[im 15/34  brain]
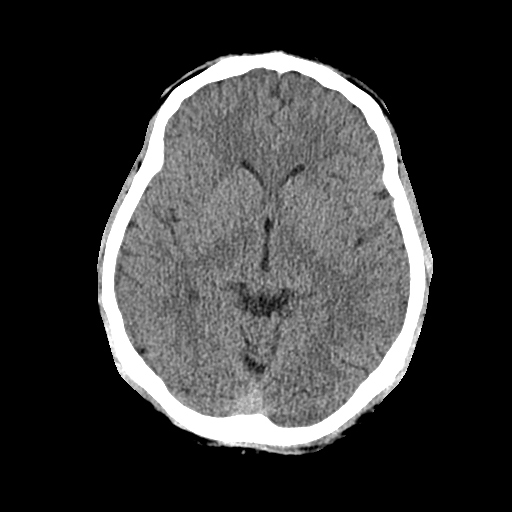
[im 19/34  brain]
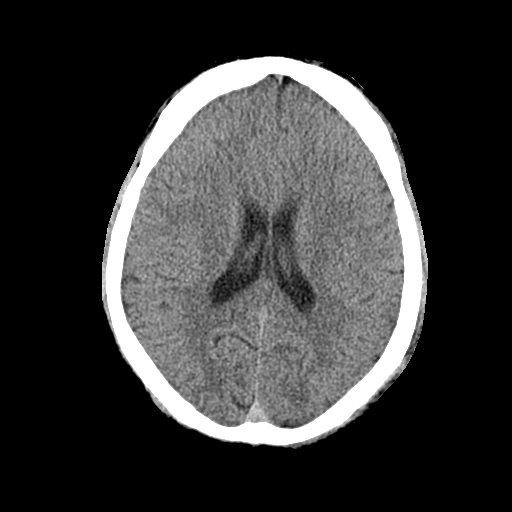
[im 19/34  bone]
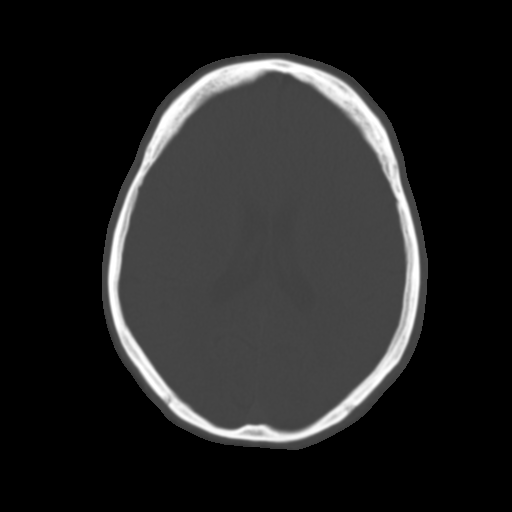
[im 23/34  brain]
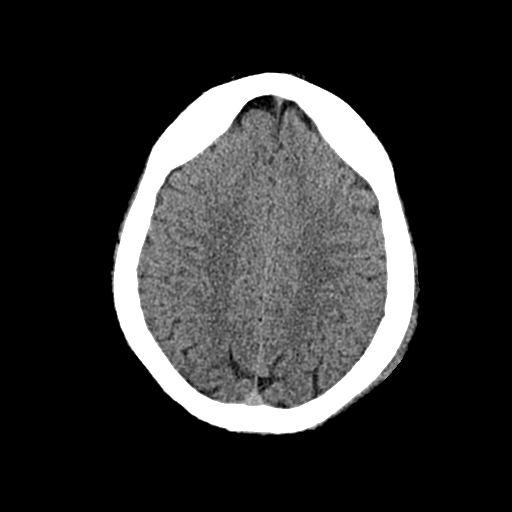
[im 27/34  brain]
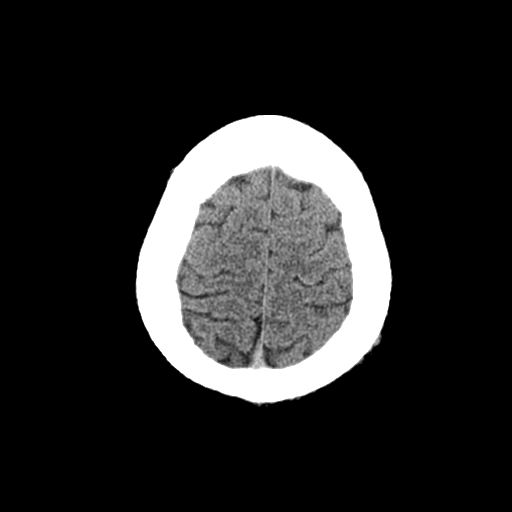
[im 31/34  brain]
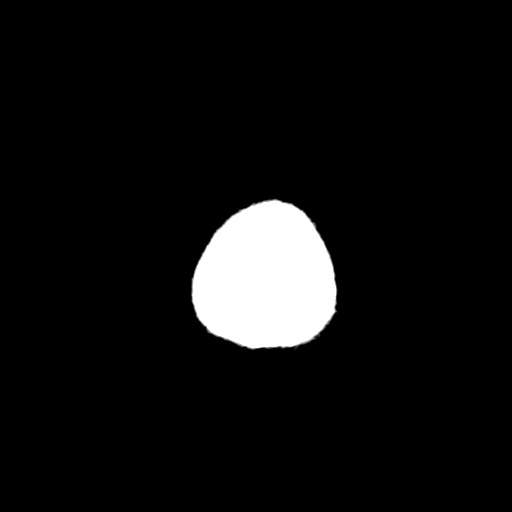

[Series 4: head coronal · coronal · 0.33mm/px · 3 of 77 slices shown]
[im 26/77  brain]
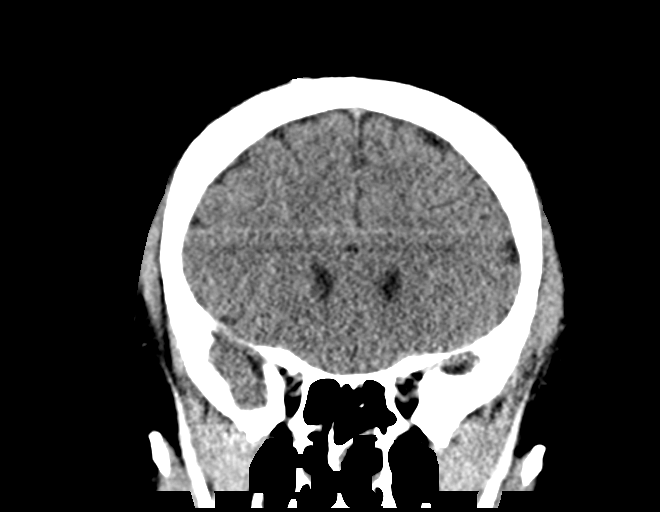
[im 34/77  brain]
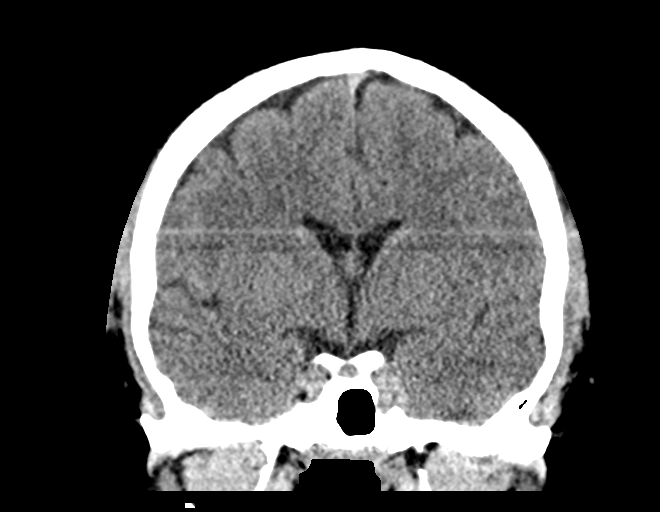
[im 43/77  brain]
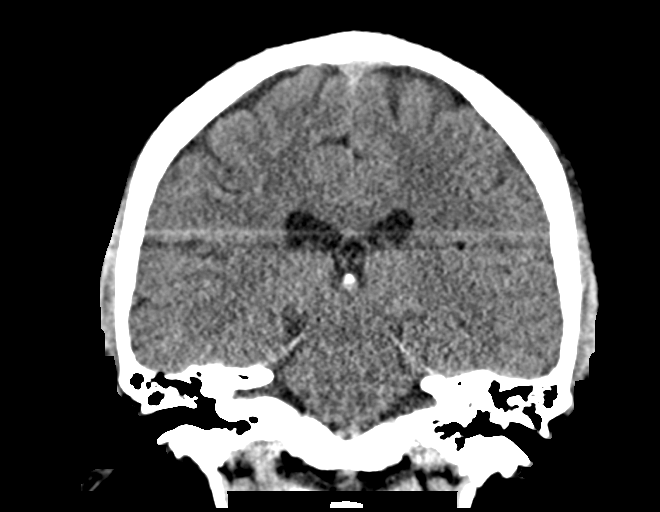

[Series 5: head sagittal · sagittal · 0.33mm/px · 3 of 70 slices shown]
[im 24/70  brain]
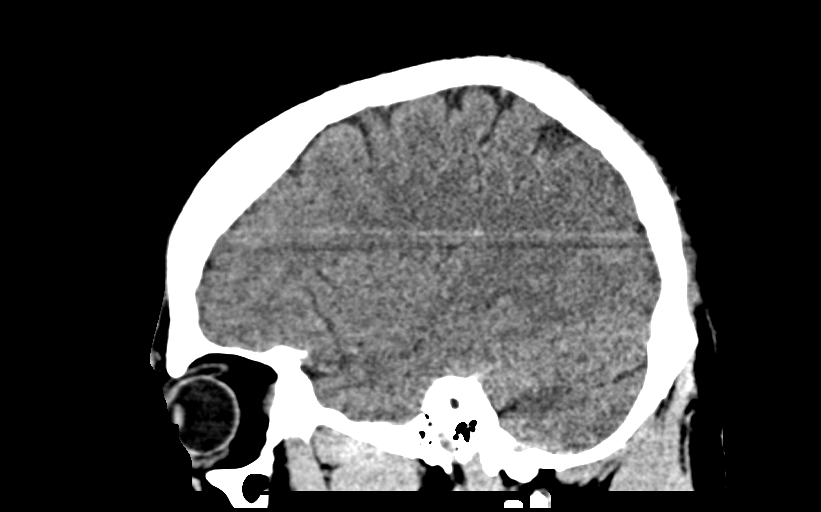
[im 35/70  brain]
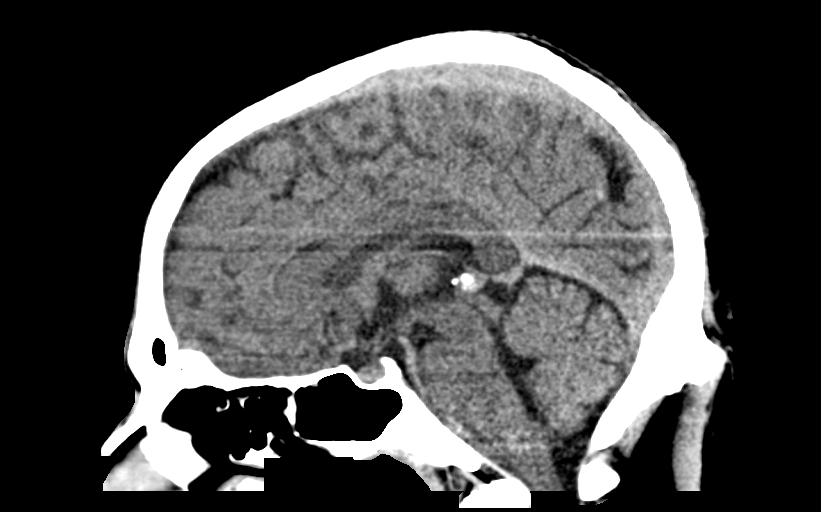
[im 47/70  brain]
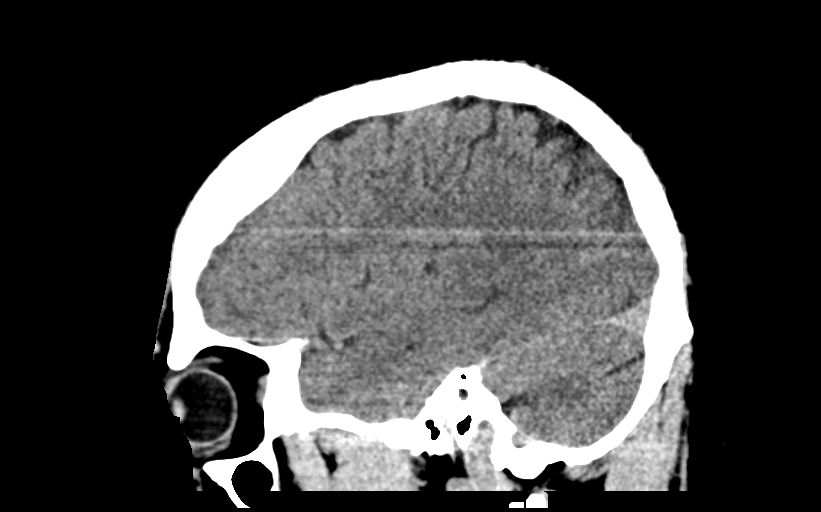

[14 of 47 positions shown; findings below may reference images not displayed]

FINDINGS: CT HEAD FINDINGS

Brain: The brain shows a normal appearance without evidence of
malformation, atrophy, old or acute small or large vessel
infarction, mass lesion, hemorrhage, hydrocephalus or extra-axial
collection.

Vascular: No hyperdense vessel. No evidence of atherosclerotic
calcification.

Skull: Normal.  No traumatic finding.  No focal bone lesion.

Sinuses/Orbits: Sinuses are clear. Orbits appear normal. Mastoids
are clear.

Other: None significant

CT MAXILLOFACIAL FINDINGS

Osseous: No facial fracture.

Orbits: No intraorbital injury. Periorbital soft tissue swelling,
left more than right.

Sinuses: Clear

Soft tissues: Periorbital soft tissue swelling superficially, left
more than right, as noted above.

CT CERVICAL SPINE FINDINGS

Alignment: No traumatic malalignment.

Skull base and vertebrae: No fracture or primary bone lesion.

Soft tissues and spinal canal: No evidence of soft tissue injury or
mass lesion.

Disc levels: No degenerative disease. No stenosis of the canal or
foramina.

Upper chest: Normal

Other: None
IMPRESSION: HEAD CT:

Normal.

MAXILLOFACIAL CT:

No facial fracture. Periorbital soft tissue swelling, left more than
right. No intraorbital injury.

CERVICAL SPINE CT:

Normal.

## 2022-01-05 ENCOUNTER — Emergency Department (HOSPITAL_COMMUNITY): Payer: Self-pay

## 2022-01-05 ENCOUNTER — Emergency Department (HOSPITAL_COMMUNITY)
Admission: EM | Admit: 2022-01-05 | Discharge: 2022-01-05 | Disposition: A | Discharge status: Home / Self Care | Payer: Self-pay | Attending: Emergency Medicine | Admitting: Emergency Medicine

## 2022-01-05 ENCOUNTER — Other Ambulatory Visit: Payer: Self-pay

## 2022-01-05 DIAGNOSIS — Z20822 Contact with and (suspected) exposure to covid-19: Secondary | ICD-10-CM | POA: Insufficient documentation

## 2022-01-05 DIAGNOSIS — J069 Acute upper respiratory infection, unspecified: Secondary | ICD-10-CM | POA: Insufficient documentation

## 2022-01-05 DIAGNOSIS — Z91040 Latex allergy status: Secondary | ICD-10-CM | POA: Insufficient documentation

## 2022-01-05 LAB — RESP PANEL BY RT-PCR (FLU A&B, COVID) ARPGX2
Influenza A by PCR: NEGATIVE
Influenza B by PCR: NEGATIVE
SARS Coronavirus 2 by RT PCR: NEGATIVE

## 2022-01-05 MED ORDER — BENZONATATE 100 MG PO CAPS: 100.0000 mg | ORAL_CAPSULE | Freq: Three times a day (TID) | ORAL | 0 refills | Status: AC

## 2022-01-05 MED ORDER — FLUTICASONE PROPIONATE 50 MCG/ACT NA SUSP: 2.0000 | Freq: Every day | NASAL | 0 refills | Status: AC

## 2022-01-05 MED ORDER — IBUPROFEN 600 MG PO TABS
600.0000 mg | ORAL_TABLET | Freq: Four times a day (QID) | ORAL | 0 refills | Status: AC | PRN
Start: 2022-01-05 — End: ?

## 2022-01-05 NOTE — ED Provider Triage Note (Addendum)
Emergency Medicine Provider Triage Evaluation Note  Ricky Reilly , a 31 y.o. male  was evaluated in triage.  Pt complains of sore throat, congestion, cough, generalized fatigue for the past 2 weeks.  Patient states he had an at home COVID test which was negative and presents to the emergency department for reevaluation of symptoms.  He has been able to tolerate oral liquids and foods without difficulty.  He denies any difficulty breathing.  Denies fever, chills, night sweats, chest pain, shortness of breath, abdominal pain, nausea/vomiting/diarrhea.  Denies known tick bite.  Review of Systems  Positive: See above Negative:   Physical Exam  BP 124/84 (BP Location: Right Arm)   Pulse 100   Temp 98.1 F (36.7 C) (Oral)   Resp 18   Ht 6' (1.829 m)   Wt (!) 140.6 kg   SpO2 95%   BMI 42.04 kg/m  Gen:   Awake, no distress   Resp:  Normal effort  MSK:   Moves extremities without difficulty  Other:  Uvula midline and rises symmetrically with phonation.  Minimal posterior pharyngeal erythema.  No murmurs gallops or rubs.  Lungs clear to auscultation bilaterally.  Medical Decision Making  Medically screening exam initiated at 8:16 PM.  Appropriate orders placed.  Kayren Eaves was informed that the remainder of the evaluation will be completed by another provider, this initial triage assessment does not replace that evaluation, and the importance of remaining in the ED until their evaluation is complete.     Peter Garter, [place] 01/05/22 2017    Peter Garter, [place] 01/05/22 2017

## 2022-01-05 NOTE — ED Triage Notes (Signed)
Patient coming to ED for evaluation of sore throat, nasal congestions, and fatigue x 2 weeks. Reports no fevers.  No sick contacts.

## 2022-01-05 NOTE — ED Notes (Signed)
I provided reinforced discharge education based off of after visit summary/care provided. Pt acknowledged and understood my education. Pt had no further questions/concerns for provider/myself. After visit summary provided to pt. 

## 2022-01-05 NOTE — ED Provider Notes (Signed)
[facility]-EMERGENCY DEPT Provider Note   CSN: [id number] Arrival date & time: 01/05/22  1915     History {Add pertinent medical, surgical, social history, OB history to HPI:1} Chief Complaint  Patient presents with  . Sore Throat  . Nasal Congestion  . Fatigue    Ricky Reilly is a 31 y.o. male.   Sore Throat  31 year old male presents emergency department with complaints of of sore throat, congestion, cough, generalized fatigue for the past 2 weeks.  Patient states he had an at home COVID test which was negative and presents to the emergency department for reevaluation of symptoms.  He has been able to tolerate oral liquids and foods without difficulty.  He denies any difficulty breathing.  Denies fever, chills, night sweats, chest pain, shortness of breath, abdominal pain, nausea/vomiting/diarrhea.  Denies known tick bite.  Home Medications Prior to Admission medications   Not on File      Allergies    Latex    Review of Systems   Review of Systems  All other systems reviewed and are negative.   Physical Exam Updated Vital Signs BP 124/84 (BP Location: Right Arm)   Pulse 100   Temp 98.1 F (36.7 C) (Oral)   Resp 18   Ht 6' (1.829 m)   Wt (!) 140.6 kg   SpO2 95%   BMI 42.04 kg/m  Physical Exam Vitals and nursing note reviewed.  Constitutional:      General: He is not in acute distress.    Appearance: He is well-developed.  HENT:     Head: Normocephalic and atraumatic.     Right Ear: Tympanic membrane normal.     Left Ear: Tympanic membrane normal.     Nose: Congestion present.     Mouth/Throat:     Mouth: Mucous membranes are dry.     Pharynx: Oropharynx is clear.     Comments: Mild posterior pharyngeal erythema.  Uvula midline rises symmetrically with phonation.  No obvious exudate.  Tonsils 1+ bilaterally. Eyes:     Conjunctiva/sclera: Conjunctivae normal.  Cardiovascular:     Rate and Rhythm: Normal rate and regular rhythm.      Heart sounds: No murmur heard. Pulmonary:     Effort: Pulmonary effort is normal. No respiratory distress.     Breath sounds: Normal breath sounds.  Abdominal:     Palpations: Abdomen is soft.     Tenderness: There is no abdominal tenderness.  Musculoskeletal:        General: No swelling.     Cervical back: Neck supple.  Skin:    General: Skin is warm and dry.     Capillary Refill: Capillary refill takes less than 2 seconds.  Neurological:     Mental Status: He is alert.  Psychiatric:        Mood and Affect: Mood normal.    ED Results / Procedures / Treatments   Labs (all labs ordered are listed, but only abnormal results are displayed) Labs Reviewed  RESP PANEL BY RT-PCR (FLU A&B, COVID) [id number]    EKG None  Radiology DG Chest 2 View  Result Date: 01/05/2022 CLINICAL DATA:  Cough. a 31 y.o. male was evaluated in triage. Pt complains of sore throat, congestion, cough, generalized fatigue for the past 2 weeks. Patient states he had an at home COVID test which was negative and presents to the emergency dep EXAM: CHEST - 2 VIEW COMPARISON:  None Available. FINDINGS: The heart and mediastinal contours are within  normal limits. No focal consolidation. No pulmonary edema. No pleural effusion. No pneumothorax. No acute osseous abnormality. IMPRESSION: No active cardiopulmonary disease. Electronically Signed   By: Tish Frederickson M.D.   On: 01/05/2022 20:33    Procedures Procedures  {Document cardiac monitor, telemetry assessment procedure when appropriate:1}  Medications Ordered in ED Medications - No data to display  ED Course/ Medical Decision Making/ A&P                           Medical Decision Making Amount and/or Complexity of Data Reviewed Radiology: ordered.  Risk Prescription drug management.   This patient presents to the ED for concern of ***, this involves an extensive number of treatment options, and is a complaint that carries with it a high risk of  complications and morbidity.  The differential diagnosis includes ***   Co morbidities that complicate the patient evaluation  ***   Additional history obtained:  Additional history obtained from *** External records from outside source obtained and reviewed including ***   Lab Tests:  I Ordered, and personally interpreted labs.  The pertinent results include:  ***   Imaging Studies ordered:  I ordered imaging studies including ***  I independently visualized and interpreted imaging which showed *** I agree with the radiologist interpretation   Cardiac Monitoring: / EKG:  The patient was maintained on a cardiac monitor.  I personally viewed and interpreted the cardiac monitored which showed an underlying rhythm of: ***   Consultations Obtained:  I requested consultation with the ***,  and discussed lab and imaging findings as well as pertinent plan - they recommend: ***   Problem List / ED Course / Critical interventions / Medication management  *** I ordered medication including ***  for ***  Reevaluation of the patient after these medicines showed that the patient {resolved/improved/worsened:23923::"improved"} I have reviewed the patients home medicines and have made adjustments as needed   Social Determinants of Health:  ***   Test / Admission - Considered:  ***   {Document critical care time when appropriate:1} {Document review of labs and clinical decision tools ie heart score, Chads2Vasc2 etc:1}  {Document your independent review of radiology images, and any outside records:1} {Document your discussion with family members, caretakers, and with consultants:1} {Document social determinants of health affecting pt's care:1} {Document your decision making why or why not admission, treatments were needed:1} Final Clinical Impression(s) / ED Diagnoses Final diagnoses:  None    Rx / DC Orders ED Discharge Orders     None

## 2022-01-05 NOTE — Discharge Instructions (Addendum)
Note the workup today was overall reassuring.  We do not see signs of pneumonia on your chest x-ray.  Your symptoms are most likely secondary to upper respiratory viral infection.  We will treat this with Cepacol lozenges as needed for sore throat, ibuprofen as needed for sore throat as well, steroid nasal spray to use once daily for nasal drainage/posterior nasal drip, cough suppressant to use as needed for cough.  It began to get to be reevaluated by her primary care provider in 3 to 5 days to make sure your symptoms are improving.  Please do not hesitate to return to the emergency department for worrisome signs and symptoms we discussed become apparent.
